# Patient Record
Sex: Female | Born: 1980 | Hispanic: Yes | Marital: Single | Smoking: Never smoker
Health system: Southern US, Community
[De-identification: ages and names within clinical notes are randomized; demographics above are authoritative.]

## PROBLEM LIST (undated history)

## (undated) DIAGNOSIS — G43909 Migraine, unspecified, not intractable, without status migrainosus: Secondary | ICD-10-CM

---

## 2008-07-15 ENCOUNTER — Ambulatory Visit: Payer: Self-pay | Admitting: Advanced Practice Midwife

## 2012-04-24 ENCOUNTER — Ambulatory Visit: Payer: Self-pay | Admitting: Advanced Practice Midwife

## 2015-10-29 ENCOUNTER — Emergency Department
Admission: EM | Admit: 2015-10-29 | Discharge: 2015-10-29 | Disposition: A | Discharge status: Home / Self Care | Payer: Self-pay | Attending: Emergency Medicine | Admitting: Emergency Medicine

## 2015-10-29 ENCOUNTER — Emergency Department: Payer: Self-pay

## 2015-10-29 ENCOUNTER — Encounter: Payer: Self-pay | Admitting: Emergency Medicine

## 2015-10-29 DIAGNOSIS — K219 Gastro-esophageal reflux disease without esophagitis: Secondary | ICD-10-CM | POA: Insufficient documentation

## 2015-10-29 DIAGNOSIS — I10 Essential (primary) hypertension: Secondary | ICD-10-CM | POA: Insufficient documentation

## 2015-10-29 DIAGNOSIS — K2970 Gastritis, unspecified, without bleeding: Secondary | ICD-10-CM | POA: Insufficient documentation

## 2015-10-29 LAB — CBC
HCT: 45.1 % (ref 35.0–47.0)
Hemoglobin: 15.8 g/dL (ref 12.0–16.0)
MCH: 30 pg (ref 26.0–34.0)
MCHC: 35 g/dL (ref 32.0–36.0)
MCV: 85.9 fL (ref 80.0–100.0)
Platelets: 253 K/uL (ref 150–440)
RBC: 5.25 MIL/uL — ABNORMAL HIGH (ref 3.80–5.20)
RDW: 12.6 % (ref 11.5–14.5)
WBC: 9.1 K/uL (ref 3.6–11.0)

## 2015-10-29 LAB — BASIC METABOLIC PANEL WITH GFR
Anion gap: 8 (ref 5–15)
BUN: 12 mg/dL (ref 6–20)
CO2: 24 mmol/L (ref 22–32)
Calcium: 9.3 mg/dL (ref 8.9–10.3)
Chloride: 106 mmol/L (ref 101–111)
Creatinine, Ser: 0.77 mg/dL (ref 0.44–1.00)
GFR calc Af Amer: >60 mL/min (ref >60)
GFR calc non Af Amer: >60 mL/min (ref >60)
Glucose, Bld: 93 mg/dL (ref 65–99)
Potassium: 3.5 mmol/L (ref 3.5–5.1)
Sodium: 138 mmol/L (ref 135–145)

## 2015-10-29 LAB — POCT PREGNANCY, URINE: PREG TEST UR: NEGATIVE

## 2015-10-29 LAB — TROPONIN I

## 2015-10-29 MED ORDER — GI COCKTAIL ~~LOC~~
30.0000 mL | Freq: Once | ORAL | Status: AC
  Administered 2015-10-29: 30 mL via ORAL
  Filled 2015-10-29: qty 30

## 2015-10-29 MED ORDER — RANITIDINE HCL 150 MG PO TABS: 150.0000 mg | ORAL_TABLET | Freq: Two times a day (BID) | ORAL | 0 refills | Status: AC

## 2015-10-29 NOTE — ED Provider Notes (Signed)
[facility] Emergency Department Provider Note   ____________________________________________   First MD Initiated Contact with Patient 10/29/15 1224     (approximate)  I have reviewed the triage vital signs and the nursing notes.   HISTORY  Chief Complaint Chest Pain and Dizziness Spanish interpreter, Damian Leavell, present for patient interaction.  HPI Brittany Barber is a 35 y.o. female without any chronic medical conditions was presenting to the emergency department with a burning sensation in her chest that started yesterday morning. She says that the pain is waxing and waning. She says that it is in the middle of her chest but also to the upper portion of the abdomen. She denies any shortness of breath. Says she has been nauseous but has not vomited. He denies any new foods or large meals or any increase in the amount of spicy food that she has been eating. Denies any radiation. Says that she also has a mild frontal headache that has been ongoing as well. Denies any family history of heart disease. Says she does not smoke or drink or use any drugs. Says the pain worsens when she lies down.   History reviewed. No pertinent past medical history.  There are no active problems to display for this patient.   History reviewed. No pertinent surgical history.  Prior to Admission medications   Not on File    Allergies Review of patient's allergies indicates not on file.  No family history on file.  Social History Social History  Substance Use Topics  . Smoking status: Never Smoker  . Smokeless tobacco: Never Used  . Alcohol use Yes     Comment: Occasionally.    Review of Systems Constitutional: No fever/chills Eyes: No visual changes. ENT: No sore throat. Cardiovascular: As above Respiratory: Denies shortness of breath. Gastrointestinal: No abdominal pain.  no vomiting.  No diarrhea.  No constipation. Genitourinary: Negative for  dysuria. Musculoskeletal: Negative for back pain. Skin: Negative for rash. Neurological: Negative for focal weakness or numbness.  10-point ROS otherwise negative.  ____________________________________________   PHYSICAL EXAM:  VITAL SIGNS: ED Triage Vitals  Enc Vitals Group     BP 10/29/15 1204 (!) 152/83     Pulse Rate 10/29/15 1204 80     Resp --      Temp 10/29/15 1204 98.2 F (36.8 C)     Temp Source 10/29/15 1204 Oral     SpO2 10/29/15 1204 100 %     Weight 10/29/15 1211 140 lb (63.5 kg)     Height 10/29/15 1211 5' (1.524 m)     Head Circumference --      Peak Flow --      Pain Score 10/29/15 1212 9     Pain Loc --      Pain Edu? --      Excl. in GC? --     Constitutional: Alert and oriented. Well appearing and in no acute distress. Eyes: Conjunctivae are normal. PERRL. EOMI. Head: Atraumatic. Nose: No congestion/rhinnorhea. Mouth/Throat: Mucous membranes are moist.  Neck: No stridor.   Cardiovascular: Normal rate, regular rhythm. Grossly normal heart sounds.  Good peripheral circulation with intact and bilateral radial as well as dorsalis pedis pulses. Reproducible tenderness over the sternum. Respiratory: Normal respiratory effort.  No retractions. Lungs CTAB. Gastrointestinal: Soft with mild epigastric as well as left upper quadrant tenderness palpation. No rebound or guarding.No distention.  Musculoskeletal: No lower extremity tenderness nor edema.  No joint effusions. Neurologic:  Normal speech  and language. No gross focal neurologic deficits are appreciated.  Skin:  Skin is warm, dry and intact. No rash noted. Psychiatric: Mood and affect are normal. Speech and behavior are normal.  ____________________________________________   LABS (all labs ordered are listed, but only abnormal results are displayed)  Labs Reviewed  CBC - Abnormal; Notable for the following:       Result Value   RBC 5.25 (*)    All other components within normal limits  BASIC  METABOLIC PANEL  TROPONIN I  POC URINE PREG, ED  POCT PREGNANCY, URINE   ____________________________________________  EKG  ED ECG REPORT I, Linton Stolp,  Teena Iraniavid M, the attending physician, personally viewed and interpreted this ECG.   Date: 10/29/2015  EKG Time: 1203  Rate: 83  Rhythm: normal sinus rhythm  Axis: Normal  Intervals:none  ST&T Change: No ST segment elevation or depression. No abnormal T-wave inversion.  ____________________________________________  RADIOLOGY  DG Chest 2 View (Accession [id number]) (Order [id number])  Imaging  Date: 10/29/2015 Department: [facility] EMERGENCY DEPARTMENT Released By: Fletcher AnonAylissa M Cordrey, RN (auto-released) Authorizing: Myrna Blazeravid Matthew Yadier Bramhall, MD  PACS Images   Show images for DG Chest 2 View  Study Result   CLINICAL DATA:  Mid sternal pain since this morning.  EXAM: CHEST  2 VIEW  COMPARISON:  None.  FINDINGS: The heart size and mediastinal contours are within normal limits. There is no focal infiltrate, pulmonary edema, or pleural effusion. The visualized skeletal structures are unremarkable.  IMPRESSION: No active cardiopulmonary disease.   Electronically Signed   By: Sherian ReinWei-Chen  Lin M.D.   On: 10/29/2015 12:57    ____________________________________________   PROCEDURES  Procedure(s) performed:   Procedures  Critical Care performed:   ____________________________________________   INITIAL IMPRESSION / ASSESSMENT AND PLAN / ED COURSE  Pertinent labs & imaging results that were available during my care of the patient were reviewed by me and considered in my medical decision making (see chart for details).  ----------------------------------------- 2:12 PM on 10/29/2015 -----------------------------------------  Patient says that the GI cocktail helped significantly. She says that she rates her pain now is a 3 out of 10. Says that the headache is gone away and she is not  feeling nauseous. On gross examination she is in no acute distress at this time. Very reassuring lab workup as well as imaging. Very unlikely for this to be serious cause of chest pain such as ACS or PE with such great improvement with the GI cocktail to the reassuring workup. She does have elevated blood pressure but does not a history of this. She has a history of hypertension pregnancy but says that she does not have chronically elevated blood pressure. Because of this isolated high blood pressure reading, the patient will [facility] pressure recheck within a week. We discussed return precautions such as worsening of the headache or further episodes of lightheadedness. The patient is understanding of this plan and willing to comply. Will be discharged with Zantac.  Clinical Course     ____________________________________________   FINAL CLINICAL IMPRESSION(S) / ED DIAGNOSES  Gastritis. Chest pain. Hypertension.    NEW MEDICATIONS STARTED DURING THIS VISIT:  New Prescriptions   No medications on file     Note:  This document was prepared using Dragon voice recognition software and may include unintentional dictation errors.    Myrna Blazeravid Matthew Jood Retana, MD 10/29/15 [phone]

## 2015-10-29 NOTE — ED Triage Notes (Signed)
Pt resents with central chest pain and states that it's burning. Denies past issues with hypertension except during pregnancy and does not take any medication for it.  Pt states pain is mostly constant. Partially relieved by sitting. Dizziness when walking.

## 2015-10-29 NOTE — ED Notes (Signed)
Waiting for interpreter for discharge

## 2015-11-02 ENCOUNTER — Emergency Department
Admission: EM | Admit: 2015-11-02 | Discharge: 2015-11-02 | Disposition: A | Discharge status: Home / Self Care | Payer: Self-pay | Attending: Emergency Medicine | Admitting: Emergency Medicine

## 2015-11-02 ENCOUNTER — Other Ambulatory Visit: Payer: Self-pay

## 2015-11-02 DIAGNOSIS — R519 Headache, unspecified: Secondary | ICD-10-CM

## 2015-11-02 DIAGNOSIS — R0602 Shortness of breath: Secondary | ICD-10-CM | POA: Insufficient documentation

## 2015-11-02 DIAGNOSIS — H5713 Ocular pain, bilateral: Secondary | ICD-10-CM | POA: Insufficient documentation

## 2015-11-02 DIAGNOSIS — R51 Headache: Secondary | ICD-10-CM | POA: Insufficient documentation

## 2015-11-02 DIAGNOSIS — R110 Nausea: Secondary | ICD-10-CM | POA: Insufficient documentation

## 2015-11-02 LAB — URINALYSIS COMPLETE WITH MICROSCOPIC (ARMC ONLY)
BILIRUBIN URINE: NEGATIVE
GLUCOSE, UA: NEGATIVE mg/dL
KETONES UR: NEGATIVE mg/dL
Nitrite: NEGATIVE
PH: 6 (ref 5.0–8.0)
Protein, ur: NEGATIVE mg/dL
Specific Gravity, Urine: 1.01 (ref 1.005–1.030)

## 2015-11-02 LAB — POCT PREGNANCY, URINE: Preg Test, Ur: NEGATIVE

## 2015-11-02 MED ORDER — NAPROXEN 500 MG PO TABS: 500.0000 mg | ORAL_TABLET | Freq: Two times a day (BID) | ORAL | 0 refills | Status: AC

## 2015-11-02 MED ORDER — DIPHENHYDRAMINE HCL 25 MG PO CAPS
25.0000 mg | ORAL_CAPSULE | Freq: Once | ORAL | Status: AC
  Administered 2015-11-02: 25 mg via ORAL
  Filled 2015-11-02: qty 1

## 2015-11-02 MED ORDER — KETOROLAC TROMETHAMINE 60 MG/2ML IM SOLN
15.0000 mg | Freq: Once | INTRAMUSCULAR | Status: AC
  Administered 2015-11-02: 15 mg via INTRAMUSCULAR
  Filled 2015-11-02: qty 2

## 2015-11-02 MED ORDER — METOCLOPRAMIDE HCL 10 MG PO TABS: 10.0000 mg | ORAL_TABLET | Freq: Four times a day (QID) | ORAL | 0 refills | Status: AC | PRN

## 2015-11-02 MED ORDER — DIPHENHYDRAMINE HCL 25 MG PO CAPS: 50.0000 mg | ORAL_CAPSULE | Freq: Four times a day (QID) | ORAL | 0 refills | Status: AC | PRN

## 2015-11-02 MED ORDER — METOCLOPRAMIDE HCL 10 MG PO TABS
10.0000 mg | ORAL_TABLET | Freq: Once | ORAL | Status: AC
  Administered 2015-11-02: 10 mg via ORAL
  Filled 2015-11-02 (×2): qty 1

## 2015-11-02 NOTE — ED Provider Notes (Signed)
[facility] Emergency Department Provider Note  ____________________________________________  Time seen: Approximately 8:47 PM  I have reviewed the triage vital signs and the nursing notes.   HISTORY  Chief Complaint Headache Patient encounter completed with assistance from Spanish interpreter Hiram  HPI Brittany Barber is a 35 y.o. female who complains of gradual onset bilateral frontal headache that started at about noon today. A was associated with some shortness of breath. The symptoms resolved for a few hours but then reoccurred later this afternoon and have been constant since then. Her headache is still present. It's also associated with pain in the eyes but without vision changes numbness tingling or weakness. She has nausea but no vomiting or diarrhea. No trauma. She does have a history of headaches but not frequently. She doesn't think she's had headaches like this before. No neck pain or stiffness. No fever. No vomiting.No aggravating or alleviating factors. Headache is moderate in intensity. Throbbing in nature.    No past medical history on file. Preeclampsia during pregnancy  There are no active problems to display for this patient.    No past surgical history on file. None  Prior to Admission medications   Medication Sig Start Date End Date Taking? Authorizing Provider  diphenhydrAMINE (BENADRYL) 25 mg capsule Take 2 capsules (50 mg total) by mouth every 6 (six) hours as needed. 11/02/15   Sharman Cheek, MD  metoCLOPramide (REGLAN) 10 MG tablet Take 1 tablet (10 mg total) by mouth every 6 (six) hours as needed. 11/02/15   Sharman Cheek, MD  naproxen (NAPROSYN) 500 MG tablet Take 1 tablet (500 mg total) by mouth 2 (two) times daily with a meal. 11/02/15   Sharman Cheek, MD  ranitidine (ZANTAC) 150 MG tablet Take 1 tablet (150 mg total) by mouth 2 (two) times daily. 10/29/15 10/28/16  Myrna Blazer, MD     Allergies Review of  patient's allergies indicates no known allergies.   No family history on file.  Social History Social History  Substance Use Topics  . Smoking status: Never Smoker  . Smokeless tobacco: Never Used  . Alcohol use Yes     Comment: Occasionally.    Review of Systems  Constitutional:   No fever Positive chills.  ENT:   No sore throat. No rhinorrhea. Cardiovascular:   No chest pain. Respiratory:   Positive shortness of breath without cough. Gastrointestinal:   Negative for abdominal pain, vomiting and diarrhea.  Genitourinary:   Negative for dysuria or difficulty urinating. Musculoskeletal:   Negative for focal pain or swelling Neurological:   Positive as above for headache 10-point ROS otherwise negative.  ____________________________________________   PHYSICAL EXAM:  VITAL SIGNS: ED Triage Vitals  Enc Vitals Group     BP 11/02/15 1859 (!) 144/91     Pulse Rate 11/02/15 1859 79     Resp 11/02/15 1859 18     Temp 11/02/15 1859 97.7 F (36.5 C)     Temp Source 11/02/15 1859 Oral     SpO2 11/02/15 1859 100 %     Weight --      Height --      Head Circumference --      Peak Flow --      Pain Score 11/02/15 1910 10     Pain Loc --      Pain Edu? --      Excl. in GC? --     Vital signs reviewed, nursing assessments reviewed.   Constitutional:  Alert and oriented. Well appearing and in no distress. Eyes:   No scleral icterus. No conjunctival pallor. PERRL. EOMI.  No nystagmus. ENT   Head:   Normocephalic and atraumatic. Normal TMs and external canals   Nose:   No congestion/rhinnorhea. No septal hematoma   Mouth/Throat:   MMM, no pharyngeal erythema. No peritonsillar mass.    Neck:   No stridor. No SubQ emphysema. No meningismus. Thyroid nonpalpable Hematological/Lymphatic/Immunilogical:   No cervical lymphadenopathy. Cardiovascular:   RRR. Symmetric bilateral radial and DP pulses.  No murmurs.  Respiratory:   Normal respiratory effort without  tachypnea nor retractions. Breath sounds are clear and equal bilaterally. No wheezes/rales/rhonchi. Gastrointestinal:   Soft and nontender. Non distended. There is no CVA tenderness.  No rebound, rigidity, or guarding. Genitourinary:   deferred Musculoskeletal:   Nontender with normal range of motion in all extremities. No joint effusions.  No lower extremity tenderness.  No edema. Neurologic:   Normal speech and language.  CN 2-10 normal. Motor grossly intact. No gross focal neurologic deficits are appreciated.  Skin:    Skin is warm, dry and intact. No rash noted.  No petechiae, purpura, or bullae.  ____________________________________________    LABS (pertinent positives/negatives) (all labs ordered are listed, but only abnormal results are displayed) Labs Reviewed  URINALYSIS COMPLETEWITH MICROSCOPIC ([facility] ONLY) - Abnormal; Notable for the following:       Result Value   Color, Urine YELLOW (*)    APPearance CLEAR (*)    Hgb urine dipstick 1+ (*)    Leukocytes, UA 1+ (*)    Bacteria, UA RARE (*)    Squamous Epithelial / LPF 0-5 (*)    All other components within normal limits  POC URINE PREG, ED  POCT PREGNANCY, URINE   ____________________________________________   EKG  Interpreted by me Normal sinus rhythm rate of 77, normal axis, normal intervals. Normal QRS ST segments and T waves.  ____________________________________________    RADIOLOGY    ____________________________________________   PROCEDURES Procedures  ____________________________________________   INITIAL IMPRESSION / ASSESSMENT AND PLAN / ED COURSE  Pertinent labs & imaging results that were available during my care of the patient were reviewed by me and considered in my medical decision making (see chart for details).  Patient well appearing no acute distress.Considering the patient's symptoms, medical history, and physical examination today, I have low suspicion for ischemic stroke,  intracranial hemorrhage, meningitis, encephalitis, carotid or vertebral dissection, venous sinus thrombosis, MS, intracranial hypertension, glaucoma, CRAO, CRVO, or temporal arteritis., I have low suspicion for ACS, PE, TAD, pneumothorax, carditis, mediastinitis, pneumonia, CHF, or sepsis.  Will treat symptomatically with Reglan and Benadryl and Toradol. Check UA and pregnancy test.     Clinical Course    ----------------------------------------- 9:59 PM on 11/02/2015 -----------------------------------------  Labs unremarkable, pregnancy negative. We'll discharge home with prescriptions for symptomatic management. Follow-up with primary care. ____________________________________________   FINAL CLINICAL IMPRESSION(S) / ED DIAGNOSES  Final diagnoses:  Acute nonintractable headache, unspecified headache type       Portions of this note were generated with dragon dictation software. Dictation errors may occur despite best attempts at proofreading.    Sharman CheekPhillip Christino Mcglinchey, MD 11/02/15 2159

## 2015-11-02 NOTE — ED Notes (Signed)
Pt. Verbalizes understanding of d/c instructions, prescriptions, and follow-up. VS stable and pain controlled per pt.  Pt. In NAD at time of d/c and denies further concerns regarding this visit. Pt. Stable at the time of departure from the unit, departing unit by the safest and most appropriate manner per that pt condition and limitations. Pt advised to return to the ED at any time for emergent concerns, or for new/worsening symptoms.      PER IN HOUSE INTERPRETER.

## 2015-11-02 NOTE — ED Triage Notes (Signed)
Pt co headache and shob since this am, co bodyaches denies any fever. Pt with no shob noted at this time with no distress.

## 2015-12-11 ENCOUNTER — Emergency Department
Admission: EM | Admit: 2015-12-11 | Discharge: 2015-12-11 | Discharge status: Against Medical Advice | Payer: Self-pay | Attending: Emergency Medicine | Admitting: Emergency Medicine

## 2015-12-11 DIAGNOSIS — Z791 Long term (current) use of non-steroidal anti-inflammatories (NSAID): Secondary | ICD-10-CM | POA: Insufficient documentation

## 2015-12-11 DIAGNOSIS — N751 Abscess of Bartholin's gland: Secondary | ICD-10-CM | POA: Insufficient documentation

## 2015-12-11 DIAGNOSIS — N764 Abscess of vulva: Secondary | ICD-10-CM

## 2015-12-11 HISTORY — DX: Migraine, unspecified, not intractable, without status migrainosus: G43.909

## 2015-12-11 MED ORDER — TRAMADOL HCL 50 MG PO TABS
50.0000 mg | ORAL_TABLET | Freq: Once | ORAL | Status: AC
  Administered 2015-12-11: 50 mg via ORAL
  Filled 2015-12-11: qty 1

## 2015-12-11 MED ORDER — DOXYCYCLINE HYCLATE 100 MG PO TABS
100.0000 mg | ORAL_TABLET | Freq: Once | ORAL | Status: AC
  Administered 2015-12-11: 100 mg via ORAL
  Filled 2015-12-11: qty 1

## 2015-12-11 MED ORDER — LIDOCAINE-EPINEPHRINE (PF) 1 %-1:200000 IJ SOLN
30.0000 mL | Freq: Once | INTRAMUSCULAR | Status: DC
  Filled 2015-12-11: qty 30

## 2015-12-11 NOTE — Discharge Instructions (Signed)
You have been advised that you have a serious infection that needs to be treated with intravenous antibiotics and possible surgery. You should return to the emergency department as soon as possible for continuation of your treatment. You are at risk of infection in your blood, other parts of your body, or even death, if you do not return for treatment.  ################################################################# Usted a sido advertida de que tiene una infeccion severa que necesita tratamiento de antibioticos intravenosos y posiblemente sirugia. Usted deve de regresar al [employer] Emergencias en cuanto le sea posible para Administrator, Civil Servicecontinuar el tratamiento. Usted corre el  riesgo de una infeccion en la Owensv[place]angre, otras partes del cuerpo y aun la muerte si no regresa por Aeronautical engineertratamiento medico.

## 2015-12-11 NOTE — ED Notes (Signed)
States she developed a possible abscess area to vagina yesterday increased pain with walking

## 2015-12-11 NOTE — ED Notes (Signed)
Explained the risk of leaving and she verbalizes understanding per interpreter

## 2015-12-11 NOTE — ED Provider Notes (Addendum)
[facility] Emergency Department Provider Note ____________________________________________  Time seen: 1413  I have reviewed the triage vital signs and the nursing notes.  HISTORY  Chief Complaint  Abscess  History limited by Spanish language. Dr. Sharma Covert available to evaluate this shared case.   HPI Brittany Barber is a 35 y.o. female presents to the ED for evaluation of a fast-growing cyst to the labia. She reports initially noting a small bump to the left labia. By today, she notes difficulty walking and pain to touch. She described discomfort with urination due to the location of the large abscess. She denies interim fevers, chills, sweats, or spontaneous drainage. She denies previous or recurrent abscess formation.   Past Medical History:  Diagnosis Date  . Migraine    There are no active problems to display for this patient.  History reviewed. No pertinent surgical history.  Prior to Admission medications   Medication Sig Start Date End Date Taking? Authorizing Provider  diphenhydrAMINE (BENADRYL) 25 mg capsule Take 2 capsules (50 mg total) by mouth every 6 (six) hours as needed. 11/02/15   Sharman Cheek, MD  metoCLOPramide (REGLAN) 10 MG tablet Take 1 tablet (10 mg total) by mouth every 6 (six) hours as needed. 11/02/15   Sharman Cheek, MD  naproxen (NAPROSYN) 500 MG tablet Take 1 tablet (500 mg total) by mouth 2 (two) times daily with a meal. 11/02/15   Sharman Cheek, MD  ranitidine (ZANTAC) 150 MG tablet Take 1 tablet (150 mg total) by mouth 2 (two) times daily. 10/29/15 10/28/16  Myrna Blazer, MD   Allergies Review of patient's allergies indicates no known allergies.  No family history on file.  Social History Social History  Substance Use Topics  . Smoking status: Never Smoker  . Smokeless tobacco: Never Used  . Alcohol use No     Comment: Occasionally.   Review of Systems  Constitutional: Negative for  fever. Cardiovascular: Negative for chest pain. Respiratory: Negative for shortness of breath. Gastrointestinal: Negative for abdominal pain, vomiting and diarrhea. Genitourinary: Negative for dysuria. Vulvar cyst abscess as above.  Musculoskeletal: Negative for back pain. Skin: Negative for rash. Neurological: Negative for headaches, focal weakness or numbness. ____________________________________________  PHYSICAL EXAM:  VITAL SIGNS: ED Triage Vitals  Enc Vitals Group     BP 12/11/15 1246 117/60     Pulse Rate 12/11/15 1246 (!) 109     Resp 12/11/15 1246 16     Temp 12/11/15 1246 98.4 F (36.9 C)     Temp Source 12/11/15 1246 Oral     SpO2 12/11/15 1246 98 %     Weight 12/11/15 1247 135 lb (61.2 kg)     Height 12/11/15 1247 5' (1.524 m)     Head Circumference --      Peak Flow --      Pain Score 12/11/15 1247 10     Pain Loc --      Pain Edu? --      Excl. in GC? --    Constitutional: Alert and oriented. Well appearing and in no distress. Head: Normocephalic and atraumatic. Cardiovascular: Normal rate, regular rhythm. Normal distal pulses. Respiratory: Normal respiratory effort. No wheezes/rales/rhonchi. Gastrointestinal: Soft and nontender. No distention. GU: normal external genitalia except for a large, firm, cystic formation to the left inner labia. It extends out of the labia has upward displacement of the bartholin's gland cyst duct.  Skin:  Skin is warm, dry and intact. No rash noted. ____________________________________________  LABS (pertinent positives/negatives) Labs Reviewed - No data to display ____________________________________________  PROCEDURES  Tramadol 50 mg PO Doxycycline 100 mg PO ____________________________________________  INITIAL IMPRESSION / ASSESSMENT AND PLAN / ED COURSE  Patient discharged from the ED at this time AMA secondary to childcare issues. It was explained to her in great detail the risk of leaving prior to the initiation  of treatment. The risks include worsening of her infection, sepsis, and possibly death. The patient verbalizes understanding, and says that she will report to the ED as soon as she can secure her children with another family member. She is further advised to avoid eating or drinking prior to return.   Clinical Course   ____________________________________________  FINAL CLINICAL IMPRESSION(S) / ED DIAGNOSES  Final diagnoses:  Vulvar abscess  Bartholin's gland abscess   Addended by Rockne MenghiniAnne-Caroline Saveon Plant, MD:  ----------------------------------------- 5:03 PM on 12/11/2015 -----------------------------------------  I personally interviewed and examined this patient with Ms. Lonia SkinnerBacon Menshew.  35 y.o. female with abscess formation noted 3 days ago, subjective fevers. On examination, this patient has a large abscess which involves the entirety of the left inner labia, and is approximately 3 x 1", fluctuant, and tender to palpation. The patient has noted significant difficulty urinating due to pain. I recommendation to the patient was to have blood drawn, followed by CT tomography to rule out extension into the perineum, and evaluation by gynecology in the emergency department. The patient opted to leave AGAINST MEDICAL ADVICE due to wanting to bring her children home. She states that she will return to the emergency department today. She understands the risks of leaving, including worsening of her condition, worsening infection including extension of her infection and bacteremia, and including death. She still opted to leave the emergency department.   Brittany IvoryJenise V Bacon [place]Menshew, PA-C 12/11/15 1620    Rockne MenghiniAnne-Caroline Chealsea Paske, MD 12/11/15 1702    Rockne MenghiniAnne-Caroline Kerin Cecchi, MD 12/11/15 1705

## 2015-12-11 NOTE — ED Triage Notes (Signed)
Pt c/o bump on vagina since yesterday..Marland Kitchen

## 2015-12-15 ENCOUNTER — Encounter: Payer: Self-pay | Admitting: *Deleted

## 2015-12-15 ENCOUNTER — Emergency Department
Admission: EM | Admit: 2015-12-15 | Discharge: 2015-12-15 | Disposition: A | Discharge status: Home / Self Care | Payer: Self-pay | Attending: Emergency Medicine | Admitting: Emergency Medicine

## 2015-12-15 DIAGNOSIS — R42 Dizziness and giddiness: Secondary | ICD-10-CM | POA: Insufficient documentation

## 2015-12-15 DIAGNOSIS — Z79899 Other long term (current) drug therapy: Secondary | ICD-10-CM | POA: Insufficient documentation

## 2015-12-15 DIAGNOSIS — R51 Headache: Secondary | ICD-10-CM | POA: Insufficient documentation

## 2015-12-15 DIAGNOSIS — R531 Weakness: Secondary | ICD-10-CM | POA: Insufficient documentation

## 2015-12-15 LAB — CBC
HCT: 40.4 % (ref 35.0–47.0)
Hemoglobin: 13.6 g/dL (ref 12.0–16.0)
MCH: 29.2 pg (ref 26.0–34.0)
MCHC: 33.7 g/dL (ref 32.0–36.0)
MCV: 86.9 fL (ref 80.0–100.0)
PLATELETS: 278 10*3/uL (ref 150–440)
RBC: 4.65 MIL/uL (ref 3.80–5.20)
RDW: 12.7 % (ref 11.5–14.5)
WBC: 9.5 10*3/uL (ref 3.6–11.0)

## 2015-12-15 LAB — URINALYSIS COMPLETE WITH MICROSCOPIC (ARMC ONLY)
BILIRUBIN URINE: NEGATIVE
Glucose, UA: NEGATIVE mg/dL
HGB URINE DIPSTICK: NEGATIVE
Ketones, ur: NEGATIVE mg/dL
Nitrite: NEGATIVE
PH: 6 (ref 5.0–8.0)
PROTEIN: NEGATIVE mg/dL
Specific Gravity, Urine: 1.004 — ABNORMAL LOW (ref 1.005–1.030)

## 2015-12-15 LAB — COMPREHENSIVE METABOLIC PANEL
ALBUMIN: 4 g/dL (ref 3.5–5.0)
ALK PHOS: 80 U/L (ref 38–126)
ALT: 20 U/L (ref 14–54)
AST: 22 U/L (ref 15–41)
Anion gap: 8 (ref 5–15)
BILIRUBIN TOTAL: 0.2 mg/dL — AB (ref 0.3–1.2)
BUN: 9 mg/dL (ref 6–20)
CALCIUM: 9 mg/dL (ref 8.9–10.3)
CO2: 21 mmol/L — AB (ref 22–32)
CREATININE: 0.81 mg/dL (ref 0.44–1.00)
Chloride: 107 mmol/L (ref 101–111)
GFR calc non Af Amer: >60 mL/min (ref >60)
Glucose, Bld: 105 mg/dL — ABNORMAL HIGH (ref 65–99)
Potassium: 3.5 mmol/L (ref 3.5–5.1)
Sodium: 136 mmol/L (ref 135–145)
TOTAL PROTEIN: 7.8 g/dL (ref 6.5–8.1)

## 2015-12-15 LAB — POCT PREGNANCY, URINE: PREG TEST UR: NEGATIVE

## 2015-12-15 MED ORDER — PRENATAL + COMPLETE MULTI 0.267 & 373 MG PO THPK: 1.0000 | PACK | Freq: Every day | ORAL | 1 refills | Status: AC

## 2015-12-15 MED ORDER — LORAZEPAM 0.5 MG PO TABS: 0.5000 mg | ORAL_TABLET | Freq: Three times a day (TID) | ORAL | 0 refills | Status: AC | PRN

## 2015-12-15 NOTE — ED Triage Notes (Addendum)
Pt states she has had a headache today and she went to the pharmacy and her blood pressure was "161". No hx of HTN, no meds for the same. Took motrin for the headache without relief. Hx of migraines, feels similar.

## 2015-12-15 NOTE — ED Provider Notes (Signed)
[facility] Emergency Department Provider Note  Time seen: 9:25 PM  I have reviewed the triage vital signs and the nursing notes.   HISTORY  Chief Complaint Headache    HPI Brittany Barber is a 35 y.o. female who presents to the emergency department with intermittent symptoms of dizziness and generalized weakness. Patient also states it is been 2 months since her last period and she is concerned that she may be pregnant. Patient denies any chest pain or abdominal pain. Denies any fever, nausea, vomiting, diarrhea or dysuria. States the dizziness is intermittent. States she feels fairly well currently.  Past Medical History:  Diagnosis Date  . Migraine     There are no active problems to display for this patient.   History reviewed. No pertinent surgical history.  Prior to Admission medications   Medication Sig Start Date End Date Taking? Authorizing Provider  diphenhydrAMINE (BENADRYL) 25 mg capsule Take 2 capsules (50 mg total) by mouth every 6 (six) hours as needed. 11/02/15   Sharman Cheek, MD  metoCLOPramide (REGLAN) 10 MG tablet Take 1 tablet (10 mg total) by mouth every 6 (six) hours as needed. 11/02/15   Sharman Cheek, MD  naproxen (NAPROSYN) 500 MG tablet Take 1 tablet (500 mg total) by mouth 2 (two) times daily with a meal. 11/02/15   Sharman Cheek, MD  ranitidine (ZANTAC) 150 MG tablet Take 1 tablet (150 mg total) by mouth 2 (two) times daily. 10/29/15 10/28/16  Myrna Blazer, MD    No Known Allergies  No family history on file.  Social History Social History  Substance Use Topics  . Smoking status: Never Smoker  . Smokeless tobacco: Never Used  . Alcohol use No     Comment: Occasionally.    Review of Systems Constitutional: Negative for fever. Cardiovascular: Negative for chest pain. Respiratory: Negative for shortness of breath. Gastrointestinal: Negative for abdominal pain, vomiting and diarrhea. Genitourinary:  Negative for dysuria. Neurological: Patient states mild headache earlier today. 10-point ROS otherwise negative.  ____________________________________________   PHYSICAL EXAM:  VITAL SIGNS: ED Triage Vitals  Enc Vitals Group     BP 12/15/15 1948 132/89     Pulse Rate 12/15/15 1948 88     Resp 12/15/15 1948 16     Temp 12/15/15 1948 97.6 F (36.4 C)     Temp Source 12/15/15 1948 Oral     SpO2 12/15/15 1948 100 %     Weight 12/15/15 1948 135 lb (61.2 kg)     Height 12/15/15 1948 5' (1.524 m)     Head Circumference --      Peak Flow --      Pain Score 12/15/15 1951 10     Pain Loc --      Pain Edu? --      Excl. in GC? --     Constitutional: Alert and oriented. Well appearing and in no distress. Eyes: Normal exam ENT   Head: Normocephalic and atraumatic   Mouth/Throat: Mucous membranes are moist. Cardiovascular: Normal rate, regular rhythm. No murmur Respiratory: Normal respiratory effort without tachypnea nor retractions. Breath sounds are clear  Gastrointestinal: Soft and nontender. No distention Musculoskeletal: Nontender with normal range of motion in all extremities.  Neurologic:  Normal speech. No gross focal neurologic deficits  Skin:  Skin is warm, dry and intact.  Psychiatric: Mood and affect are normal.   ____________________________________________    INITIAL IMPRESSION / ASSESSMENT AND PLAN / ED COURSE  Pertinent labs &  imaging results that were available during my care of the patient were reviewed by me and considered in my medical decision making (see chart for details).  Patient presents with dizziness, states her blood pressure was elevated earlier today, also states mild headache earlier today which is largely resolved. Patient is concerned that she is not had a period in 2 months and thinks she might be pregnant. Overall the patient is a very normal physical exam. Patient's labs are within normal limits. Blood pressure 132/89. I discussed with  the patient treating plenty of fluids obtaining plenty of rest and attempting to increase her oral intake as she states she is eating very little.  ____________________________________________   FINAL CLINICAL IMPRESSION(S) / ED DIAGNOSES  dizziness    Minna AntisKevin Chava Dulac, MD 12/15/15 2129

## 2015-12-17 LAB — URINE CULTURE: CULTURE: NO GROWTH

## 2017-11-26 IMAGING — CR DG CHEST 2V
2 series · 2 of 2 positions shown · non-contrast
Comparison: None.

CLINICAL DATA: Mid sternal pain since this morning.

EXAM:
CHEST  2 VIEW

[chest pa]
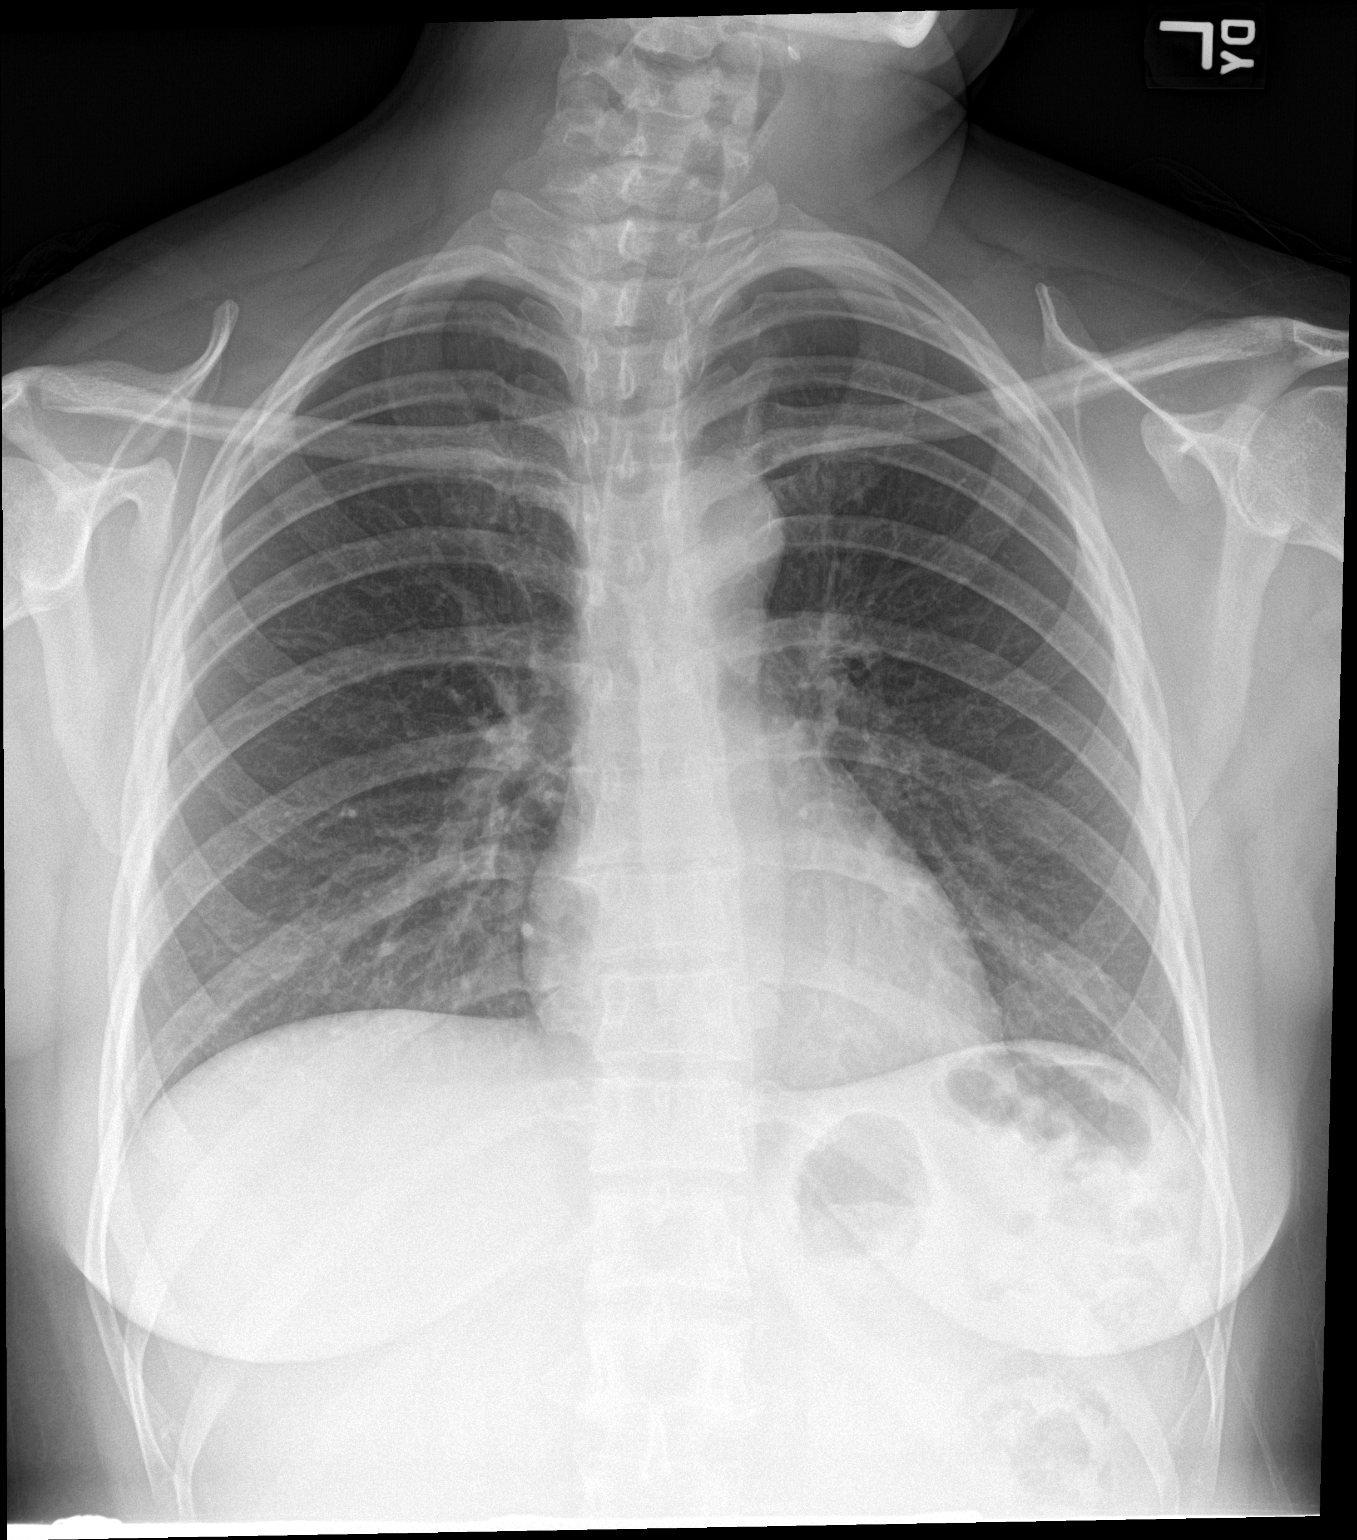

[chest lat]
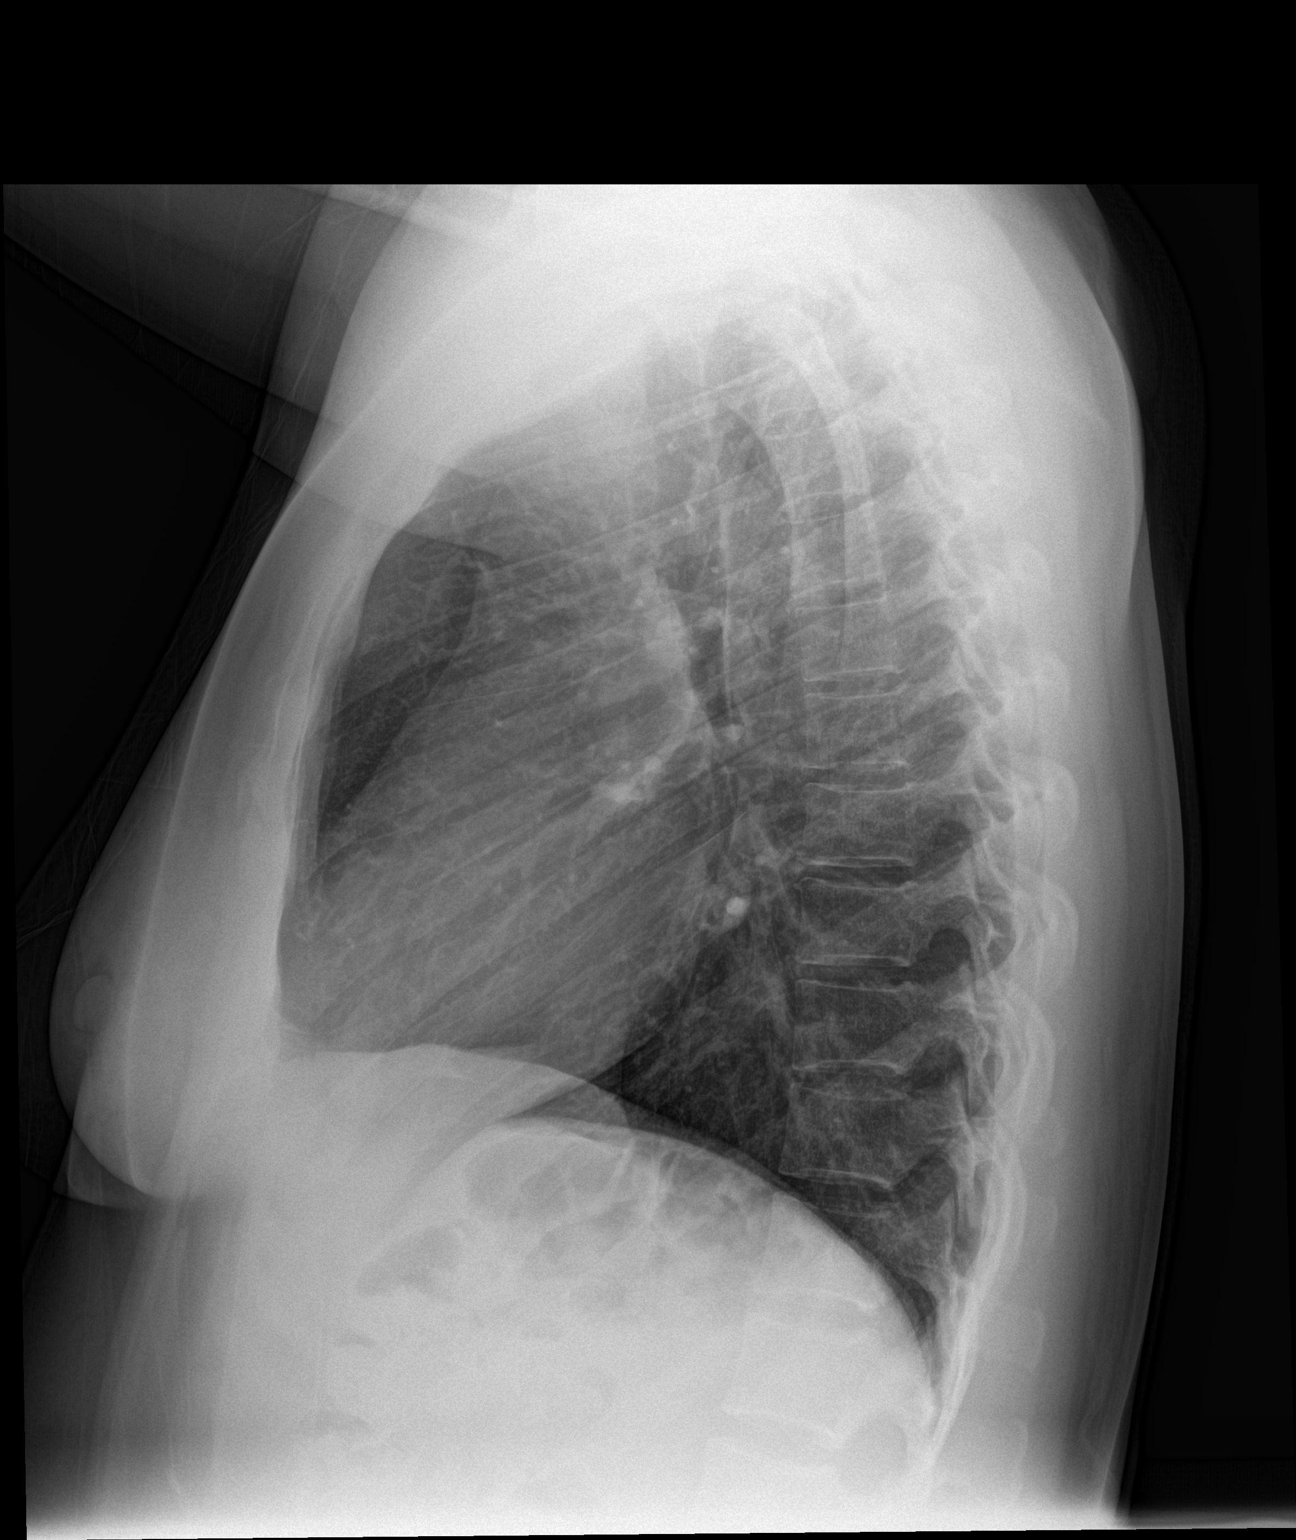

[2 of 2 positions shown; findings below may reference images not displayed]

FINDINGS: The heart size and mediastinal contours are within normal limits.
There is no focal infiltrate, pulmonary edema, or pleural effusion.
The visualized skeletal structures are unremarkable.
IMPRESSION: No active cardiopulmonary disease.

## 2018-08-12 ENCOUNTER — Encounter: Payer: Self-pay | Admitting: Emergency Medicine

## 2018-08-12 ENCOUNTER — Emergency Department
Admission: EM | Admit: 2018-08-12 | Discharge: 2018-08-12 | Disposition: A | Discharge status: Home / Self Care | Payer: Self-pay | Attending: Emergency Medicine | Admitting: Emergency Medicine

## 2018-08-12 ENCOUNTER — Other Ambulatory Visit: Payer: Self-pay

## 2018-08-12 DIAGNOSIS — Z79899 Other long term (current) drug therapy: Secondary | ICD-10-CM | POA: Insufficient documentation

## 2018-08-12 DIAGNOSIS — N764 Abscess of vulva: Secondary | ICD-10-CM | POA: Insufficient documentation

## 2018-08-12 MED ORDER — SULFAMETHOXAZOLE-TRIMETHOPRIM 800-160 MG PO TABS: 1.0000 | ORAL_TABLET | Freq: Two times a day (BID) | ORAL | 0 refills | Status: AC

## 2018-08-12 MED ORDER — HYDROCODONE-ACETAMINOPHEN 5-325 MG PO TABS: 1.0000 | ORAL_TABLET | Freq: Four times a day (QID) | ORAL | 0 refills | Status: AC | PRN

## 2018-08-12 NOTE — ED Notes (Signed)
Reference triage. Pt ambulatory to ER room. Pt denies fevers at home. Pt in NAD at this time.

## 2018-08-12 NOTE — ED Triage Notes (Signed)
Pt presents to ED with abscess on her vagina. Noticed it this morning. Painful to touch. Hx of the same.

## 2018-08-12 NOTE — ED Provider Notes (Signed)
[facility] Emergency Department Provider Note  ____________________________________________  Time seen: Approximately 10:21 PM  I have reviewed the triage vital signs and the nursing notes.   HISTORY  Chief Complaint Abscess    HPI Brittany Barber is a 38 y.o. 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  Cyst has ruptured spontaneously and is draining malodorous, purulent, copious fluid.  Patient reports having 1 previous Bartholin cyst.   Past Medical History:  Diagnosis Date  . Migraine     There are no active problems to display for this patient.   History reviewed. No pertinent surgical history.  Prior to Admission medications   Medication Sig Start Date End Date Taking? Authorizing Provider  diphenhydrAMINE (BENADRYL) 25 mg capsule Take 2 capsules (50 mg total) by mouth every 6 (six) hours as needed. 11/02/15   Sharman CheekStafford, Phillip, MD  HYDROcodone-acetaminophen (NORCO/VICODIN) 5-325 MG tablet Take 1 tablet by mouth every 6 (six) hours as needed for up to 3 days for severe pain. 08/12/18 08/15/18  Marisa Hufstetler, Kasandra Knudsenari B, FNP  metoCLOPramide (REGLAN) 10 MG tablet Take 1 tablet (10 mg total) by mouth every 6 (six) hours as needed. 11/02/15   Sharman CheekStafford, Phillip, MD  naproxen (NAPROSYN) 500 MG tablet Take 1 tablet (500 mg total) by mouth 2 (two) times daily with a meal. 11/02/15   Sharman CheekStafford, Phillip, MD  Prenat-Methylfol-Chol-Fish Oil (PRENATAL + COMPLETE MULTI) 0.267 & 373 MG THPK Take 1 tablet by mouth daily. 12/15/15   Minna AntisPaduchowski, Kevin, MD  ranitidine (ZANTAC) 150 MG tablet Take 1 tablet (150 mg total) by mouth 2 (two) times daily. 10/29/15 10/28/16  Myrna BlazerSchaevitz, David Matthew, MD  sulfamethoxazole-trimethoprim (BACTRIM DS) 800-160 MG tablet Take 1 tablet by mouth 2 (two) times daily. 08/12/18   Chinita Pesterriplett, Therese Rocco B, FNP    Allergies Patient has no known allergies.  No family history on file.  Social  History Social History   Tobacco Use  . Smoking status: Never Smoker  . Smokeless tobacco: Never Used  Substance Use Topics  . Alcohol use: No    Comment: Occasionally.  . Drug use: No    Review of Systems Constitutional: Negative for fever. Respiratory: Negative for shortness of breath or cough. Gastrointestinal: Negative for abdominal pain; negative for nausea , negative for vomiting. Genitourinary: Negative for dysuria , negative for vaginal discharge. Musculoskeletal: Negative for back pain. Skin: Positive for left labial abscess ____________________________________________   PHYSICAL EXAM:  VITAL SIGNS: ED Triage Vitals  Enc Vitals Group     BP 08/12/18 2133 139/86     Pulse Rate 08/12/18 2133 (!) 107     Resp 08/12/18 2133 18     Temp 08/12/18 2133 98.1 F (36.7 C)     Temp Source 08/12/18 2133 Oral     SpO2 08/12/18 2133 98 %     Weight 08/12/18 2136 162 lb (73.5 kg)     Height 08/12/18 2136 5\' 2"  (1.575 m)     Head Circumference --      Peak Flow --      Pain Score 08/12/18 2134 10     Pain Loc --      Pain Edu? --      Excl. in GC? --     Constitutional: Alert and oriented. Well appearing and in no acute distress. Eyes: Conjunctivae are normal. Head: Atraumatic. Nose: No congestion/rhinnorhea. Mouth/Throat: Mucous membranes are moist. Respiratory: Normal respiratory effort.  No retractions. Gastrointestinal: Bowel sounds  active x 4; Abdomen is soft without rebound or guarding. Genitourinary: Pelvic exam: Not indicated.  Draining left labial abscess on exam Musculoskeletal: No extremity tenderness nor edema.  Neurologic:  Normal speech and language. No gross focal neurologic deficits are appreciated. Speech is normal. No gait instability. Skin:  Skin is warm, dry and intact. No rash noted on exposed skin. Psychiatric: Mood and affect are normal. Speech and behavior are normal.  ____________________________________________   LABS (all labs ordered are  listed, but only abnormal results are displayed)  Labs Reviewed - No data to display ____________________________________________  RADIOLOGY  Not indicated ____________________________________________  Procedures  ____________________________________________  38 year old female presenting to the emergency department for treatment and evaluation of labial abscess.  Abscess spontaneously drained prior to my evaluation.  Via the interpreter, she was advised that she will need to take Bactrim until finished.  She was provided a short course of Norco for pain.  She was advised that she should follow-up with gynecology if the area begins to become tender again in the future.  She was provided discharge instructions in Spanish to further explain Bartholin cyst.  She was encouraged to return to the emergency department for symptoms of change or worsen if she is unable to schedule an appointment with gynecology.  INITIAL IMPRESSION / ASSESSMENT AND PLAN / ED COURSE  Pertinent labs & imaging results that were available during my care of the patient were reviewed by me and considered in my medical decision making (see chart for details).  ____________________________________________   FINAL CLINICAL IMPRESSION(S) / ED DIAGNOSES  Final diagnoses:  Left genital labial abscess    Note:  This document was prepared using Dragon voice recognition software and may include unintentional dictation errors.   Victorino Dike, FNP 08/12/18 2247    Nena Polio, MD 08/12/18 2325

## 2023-03-27 ENCOUNTER — Ambulatory Visit: Payer: Self-pay
